# Patient Record
Sex: Female | Born: 1955 | Hispanic: Yes | Marital: Married | Smoking: Never smoker
Health system: Southern US, Community
[De-identification: ages and names within clinical notes are randomized; demographics above are authoritative.]

## PROBLEM LIST (undated history)

## (undated) DIAGNOSIS — I10 Essential (primary) hypertension: Secondary | ICD-10-CM

## (undated) HISTORY — DX: Essential (primary) hypertension: I10

---

## 2011-05-16 ENCOUNTER — Other Ambulatory Visit: Payer: Self-pay | Admitting: Obstetrics and Gynecology

## 2011-05-16 DIAGNOSIS — N63 Unspecified lump in unspecified breast: Secondary | ICD-10-CM

## 2011-05-16 DIAGNOSIS — N644 Mastodynia: Secondary | ICD-10-CM

## 2011-06-02 ENCOUNTER — Ambulatory Visit
Admission: RE | Admit: 2011-06-02 | Discharge: 2011-06-02 | Disposition: A | Discharge status: Home / Self Care | Payer: 59 | Source: Ambulatory Visit | Attending: Obstetrics and Gynecology | Admitting: Obstetrics and Gynecology

## 2011-06-02 ENCOUNTER — Other Ambulatory Visit: Payer: Self-pay | Admitting: Obstetrics and Gynecology

## 2011-06-02 DIAGNOSIS — N644 Mastodynia: Secondary | ICD-10-CM

## 2011-06-02 DIAGNOSIS — N63 Unspecified lump in unspecified breast: Secondary | ICD-10-CM

## 2012-05-02 ENCOUNTER — Other Ambulatory Visit: Payer: Self-pay | Admitting: Obstetrics and Gynecology

## 2012-05-02 DIAGNOSIS — Z872 Personal history of diseases of the skin and subcutaneous tissue: Secondary | ICD-10-CM

## 2012-05-02 DIAGNOSIS — Z1231 Encounter for screening mammogram for malignant neoplasm of breast: Secondary | ICD-10-CM

## 2012-06-07 ENCOUNTER — Ambulatory Visit
Admission: RE | Admit: 2012-06-07 | Discharge: 2012-06-07 | Disposition: A | Discharge status: Home / Self Care | Payer: 59 | Source: Ambulatory Visit | Attending: Obstetrics and Gynecology | Admitting: Obstetrics and Gynecology

## 2012-06-07 DIAGNOSIS — Z872 Personal history of diseases of the skin and subcutaneous tissue: Secondary | ICD-10-CM

## 2012-06-07 DIAGNOSIS — Z1231 Encounter for screening mammogram for malignant neoplasm of breast: Secondary | ICD-10-CM

## 2012-06-12 ENCOUNTER — Encounter: Payer: Self-pay | Admitting: Obstetrics and Gynecology

## 2012-06-19 ENCOUNTER — Encounter: Payer: Self-pay | Admitting: Obstetrics and Gynecology

## 2012-06-19 ENCOUNTER — Ambulatory Visit (INDEPENDENT_AMBULATORY_CARE_PROVIDER_SITE_OTHER): Payer: 59 | Admitting: Obstetrics and Gynecology

## 2012-06-19 VITALS — BP 122/80 | Ht 59.0 in | Wt 111.0 lb

## 2012-06-19 DIAGNOSIS — Z124 Encounter for screening for malignant neoplasm of cervix: Secondary | ICD-10-CM

## 2012-06-19 NOTE — Patient Instructions (Signed)
Control del estrés  (Stress Management)  El estrés es un estado de tensión físico o mental que puede ser el resultado de cambios en su vida o en su rutina habitual. Algunas causas frecuentes de estrés son:  · La muerte de los seres queridos.  · Daños o enfermedades graves.  · Ser despedido o cambiar de empleo.  · O mudarse a una nueva casa.  Causas pueden ser:  · Problemas sexuales.  · Pérdidas económicas o financieras.  · Tener grandes deudas.  · Conflictos con alguna persona en la casa o en el empleo.  · O cansancio permanente por falta de sueño.  No sólo las cosas malas producen estrés. Puede ser estresante:  · Ganar la lotería.  · Casarse.  · Comprar Un automóvil nuevo.  La cantidad de estrés que puede tolerarse fácilmente, varía de una persona a otra. Los cambios generalmente causan estrés, independientemente del tipo de cambio. Demasiado estrés puede afectar la salud. Puede conducir a problemas físicos o emocionales. Demasiado poco estrés (aburrimiento) también puede llegar a ser estresante.  SUGERENCIAS PARA REDUCIR EL ESTRÉS:  · Converse acerca de las cosas que lo preocupan con su familia o sus amigos. Generalmente es bueno compartir las preocupaciones. Si siente que el problema es grave, podrá necesitar ayuda de un profesional para que lo oriente.  · Considere los problemas de a uno por vez, en lugar de tratarlos todos juntos. Tratar de ocuparse de todo al mismo tiempo es imposible. Haga una lista de todas las cosas que necesita hacer y luego comience por la más importante. Propóngase el objetivo de hacer 2 ó 3 cosas por día. Si se propone hacer demasiadas cosas en un día, por supuesto fracasará, y esto le causará aún más estrés.  · No consuma drogas ni alcohol para aliviar el estrés. Aunque lo hagan sentir mejor por un breve tiempo, no resuelven los problemas que causa el estrés. También pueden causarle adicción.  · Practique ejercicios con regularidad, al menos tres veces por semana. El ejercicio físico  puede ayudarlo a aliviar la sensación de "nerviosismo" y lo hará relajar.  · La distancia más corta entre la desesperación y la esperanza a menudo es una buena noche de sueño.  · Vaya a dormir y levántese con el tiempo suficiente para cumplir con sus obligaciones sin estar apurado.  · Tómese un breve período de descanso de las situaciones estresantes que se producen durante el día. Cierre los ojos y respire profundo. Comience con los músculos del rostro, ténselos, mantenga durante algunos segundos y luego relájese. Repítalo con los músculos del cuello, hombros, manos, estómago, espalda y piernas.  · Cuídese adecuadamente. Consuma una dieta balanceada y descanse lo suficiente.  · Programe un tiempo para divertirse. Tómese un descanso en la rutina diaria para relajarse.  INSTRUCCIONES PARA EL CUIDADO DOMICILIARIO  · Pida ayuda si se siente abrumado por sus problemas y siente que no puede controlarlos solo.  · Regrese inmediatamente si siente que se está lastimando a usted mismo o a otra persona.  Document Released: 05/17/2005 Document Revised: 10/30/2011  [employer]® Patient Information ©2013 [employer].

## 2012-06-19 NOTE — Progress Notes (Signed)
Last Pap: 04/2011 WNL: Yes Regular Periods:no Contraception: n/a  Monthly Breast exam:yes Tetanus<71yrs:yes Nl.Bladder Function:yes Daily BMs:yes Healthy Diet:yes Calcium:yes Mammogram:yes Date of Mammogram: 06/07/12 Exercise:yes Have often Exercise: walking Seatbelt: yes Abuse at home: no Stressful work:no Sigmoid-colonoscopy: n/a Bone Density: No PCP: Dr. Gayleen Orem Change in PMH: no change Change in FMH:no change  Pt wants to discuss dense breast letter she received in the mail BP 122/80  Ht 4\' 11"  (1.499 m)  Wt 111 lb (50.349 kg)  BMI 22.42 kg/m2 Pt with complaints:yes and her husband has erectile dysfunction.  She wants tips on sexual satisfaction Physical Examination: General appearance - alert, well appearing, and in no distress Mental status - normal mood, behavior, speech, dress, motor activity, and thought processes Neck - supple, no significant adenopathy,  thyroid exam: thyroid is normal in size without nodules or tenderness Chest - clear to auscultation, no wheezes, rales or rhonchi, symmetric air entry Heart - normal rate and regular rhythm Abdomen - soft, nontender, nondistended, no masses or organomegaly Breasts - breasts appear normal, no suspicious masses, no skin or nipple changes or axillary nodes Pelvic - normal external genitalia, vulva, vagina, cervix, uterus and adnexa Rectal - declined by the patient Back exam - full range of motion, no tenderness, palpable spasm or pain on motion Neurological - alert, oriented, normal speech, no focal findings or movement disorder noted Musculoskeletal - no joint tenderness, deformity or swelling Extremities - no edema, redness or tenderness in the calves or thighs Skin - normal coloration and turgor, no rashes, no suspicious skin lesions noted Routine exam Pap sent yes Mammogram due no pt declined colonoscopy.  pt given hemocult cards  RT 1 yr

## 2012-06-20 LAB — PAP IG W/ RFLX HPV ASCU

## 2013-05-20 ENCOUNTER — Other Ambulatory Visit: Payer: Self-pay

## 2013-05-20 DIAGNOSIS — Z1231 Encounter for screening mammogram for malignant neoplasm of breast: Secondary | ICD-10-CM

## 2013-06-09 ENCOUNTER — Ambulatory Visit: Payer: 59

## 2013-07-01 ENCOUNTER — Ambulatory Visit
Admission: RE | Admit: 2013-07-01 | Discharge: 2013-07-01 | Disposition: A | Discharge status: Home / Self Care | Payer: 59 | Source: Ambulatory Visit

## 2013-07-01 DIAGNOSIS — Z1231 Encounter for screening mammogram for malignant neoplasm of breast: Secondary | ICD-10-CM

## 2014-05-28 ENCOUNTER — Other Ambulatory Visit: Payer: Self-pay

## 2014-05-28 DIAGNOSIS — Z1239 Encounter for other screening for malignant neoplasm of breast: Secondary | ICD-10-CM

## 2014-07-01 ENCOUNTER — Other Ambulatory Visit: Payer: Self-pay

## 2014-07-01 DIAGNOSIS — Z1231 Encounter for screening mammogram for malignant neoplasm of breast: Secondary | ICD-10-CM

## 2014-07-02 ENCOUNTER — Ambulatory Visit
Admission: RE | Admit: 2014-07-02 | Discharge: 2014-07-02 | Disposition: A | Discharge status: Home / Self Care | Payer: 59 | Source: Ambulatory Visit

## 2014-07-02 DIAGNOSIS — Z1231 Encounter for screening mammogram for malignant neoplasm of breast: Secondary | ICD-10-CM

## 2015-06-08 ENCOUNTER — Other Ambulatory Visit: Payer: Self-pay

## 2015-06-08 DIAGNOSIS — Z1231 Encounter for screening mammogram for malignant neoplasm of breast: Secondary | ICD-10-CM

## 2015-07-30 ENCOUNTER — Ambulatory Visit
Admission: RE | Admit: 2015-07-30 | Discharge: 2015-07-30 | Disposition: A | Discharge status: Home / Self Care | Payer: 59 | Source: Ambulatory Visit

## 2015-07-30 DIAGNOSIS — Z1231 Encounter for screening mammogram for malignant neoplasm of breast: Secondary | ICD-10-CM

## 2016-07-05 ENCOUNTER — Other Ambulatory Visit: Payer: Self-pay | Admitting: Obstetrics

## 2016-07-05 DIAGNOSIS — Z1231 Encounter for screening mammogram for malignant neoplasm of breast: Secondary | ICD-10-CM

## 2016-08-02 ENCOUNTER — Ambulatory Visit
Admission: RE | Admit: 2016-08-02 | Discharge: 2016-08-02 | Disposition: A | Discharge status: Home / Self Care | Payer: 59 | Source: Ambulatory Visit | Attending: Obstetrics | Admitting: Obstetrics

## 2016-08-02 DIAGNOSIS — Z1231 Encounter for screening mammogram for malignant neoplasm of breast: Secondary | ICD-10-CM

## 2017-11-21 ENCOUNTER — Other Ambulatory Visit: Payer: Self-pay | Admitting: Family Medicine

## 2017-11-21 DIAGNOSIS — Z1231 Encounter for screening mammogram for malignant neoplasm of breast: Secondary | ICD-10-CM

## 2017-12-19 ENCOUNTER — Ambulatory Visit
Admission: RE | Admit: 2017-12-19 | Discharge: 2017-12-19 | Disposition: A | Discharge status: Home / Self Care | Payer: PRIVATE HEALTH INSURANCE | Source: Ambulatory Visit | Attending: Family Medicine | Admitting: Family Medicine

## 2017-12-19 DIAGNOSIS — Z1231 Encounter for screening mammogram for malignant neoplasm of breast: Secondary | ICD-10-CM

## 2019-03-06 ENCOUNTER — Other Ambulatory Visit: Payer: Self-pay | Admitting: Family Medicine

## 2019-03-06 DIAGNOSIS — Z1231 Encounter for screening mammogram for malignant neoplasm of breast: Secondary | ICD-10-CM

## 2019-04-22 ENCOUNTER — Ambulatory Visit
Admission: RE | Admit: 2019-04-22 | Discharge: 2019-04-22 | Disposition: A | Discharge status: Home / Self Care | Payer: PRIVATE HEALTH INSURANCE | Source: Ambulatory Visit | Attending: Family Medicine | Admitting: Family Medicine

## 2019-04-22 ENCOUNTER — Other Ambulatory Visit: Payer: Self-pay

## 2019-04-22 DIAGNOSIS — Z1231 Encounter for screening mammogram for malignant neoplasm of breast: Secondary | ICD-10-CM

## 2019-10-18 ENCOUNTER — Ambulatory Visit: Payer: PRIVATE HEALTH INSURANCE | Attending: Internal Medicine

## 2019-10-18 DIAGNOSIS — Z23 Encounter for immunization: Secondary | ICD-10-CM | POA: Insufficient documentation

## 2019-10-18 NOTE — Progress Notes (Signed)
   Covid-19 Vaccination Clinic  Name:  Kimberly Woodard    MRN: [id number] DOB: 1955-09-15  10/18/2019  Kimberly Woodard was observed post Covid-19 immunization for 15 minutes without incidence. She was provided with Vaccine Information Sheet and instruction to access the V-Safe system.   Kimberly Woodard was instructed to call 911 with any severe reactions post vaccine: Marland Kitchen Difficulty breathing  . Swelling of your face and throat  . A fast heartbeat  . A bad rash all over your body  . Dizziness and weakness    Immunizations Administered    Name Date Dose VIS Date Route   Pfizer COVID-19 Vaccine 10/18/2019 11:06 AM 0.3 mL 08/01/2019 Intramuscular   Manufacturer: [employer], [employer]   Lot: [id number]   NDC: [address]-1

## 2019-11-08 ENCOUNTER — Ambulatory Visit: Payer: PRIVATE HEALTH INSURANCE | Attending: Internal Medicine

## 2019-11-08 DIAGNOSIS — Z23 Encounter for immunization: Secondary | ICD-10-CM

## 2019-11-08 NOTE — Progress Notes (Signed)
   Covid-19 Vaccination Clinic  Name:  JANESA DOCKERY    MRN: [id number] DOB: June 20, 1956  11/08/2019  Ms. England was observed post Covid-19 immunization for 15 minutes without incident. She was provided with Vaccine Information Sheet and instruction to access the V-Safe system.   Ms. Sangiovanni was instructed to call 911 with any severe reactions post vaccine: Marland Kitchen Difficulty breathing  . Swelling of face and throat  . A fast heartbeat  . A bad rash all over body  . Dizziness and weakness   Immunizations Administered    Name Date Dose VIS Date Route   Pfizer COVID-19 Vaccine 11/08/2019 12:22 PM 0.3 mL 08/01/2019 Intramuscular   Manufacturer: [employer], [employer]   Lot: [id number]   NDC: [address]-6

## 2019-11-12 ENCOUNTER — Ambulatory Visit: Payer: PRIVATE HEALTH INSURANCE

## 2020-05-31 ENCOUNTER — Other Ambulatory Visit: Payer: Self-pay | Admitting: Family Medicine

## 2020-05-31 DIAGNOSIS — Z1231 Encounter for screening mammogram for malignant neoplasm of breast: Secondary | ICD-10-CM

## 2020-06-24 ENCOUNTER — Ambulatory Visit
Admission: RE | Admit: 2020-06-24 | Discharge: 2020-06-24 | Disposition: A | Discharge status: Home / Self Care | Payer: PRIVATE HEALTH INSURANCE | Source: Ambulatory Visit | Attending: Family Medicine | Admitting: Family Medicine

## 2020-06-24 ENCOUNTER — Other Ambulatory Visit: Payer: Self-pay

## 2020-06-24 DIAGNOSIS — Z1231 Encounter for screening mammogram for malignant neoplasm of breast: Secondary | ICD-10-CM

## 2020-08-03 ENCOUNTER — Other Ambulatory Visit: Payer: Self-pay

## 2020-08-03 ENCOUNTER — Ambulatory Visit
Admission: RE | Admit: 2020-08-03 | Discharge: 2020-08-03 | Disposition: A | Discharge status: Home / Self Care | Payer: PRIVATE HEALTH INSURANCE | Source: Ambulatory Visit | Attending: Family Medicine | Admitting: Family Medicine

## 2021-09-12 ENCOUNTER — Other Ambulatory Visit: Payer: Self-pay | Admitting: Family Medicine

## 2021-09-12 DIAGNOSIS — Z1231 Encounter for screening mammogram for malignant neoplasm of breast: Secondary | ICD-10-CM

## 2021-09-21 ENCOUNTER — Ambulatory Visit
Admission: RE | Admit: 2021-09-21 | Discharge: 2021-09-21 | Disposition: A | Discharge status: Home / Self Care | Payer: BC Managed Care – PPO | Source: Ambulatory Visit | Attending: Family Medicine | Admitting: Family Medicine

## 2021-09-21 DIAGNOSIS — Z1231 Encounter for screening mammogram for malignant neoplasm of breast: Secondary | ICD-10-CM

## 2023-06-18 IMAGING — MG MM DIGITAL SCREENING BILAT W/ TOMO AND CAD
8 series · 9 of 24 positions shown · non-contrast
Comparison: Previous exam(s).

CLINICAL DATA: Screening.

EXAM:
DIGITAL SCREENING BILATERAL MAMMOGRAM WITH TOMOSYNTHESIS AND CAD
TECHNIQUE: Bilateral screening digital craniocaudal and mediolateral oblique
mammograms were obtained. Bilateral screening digital breast
tomosynthesis was performed. The images were evaluated with
computer-aided detection.

[L MLO synth-2D]
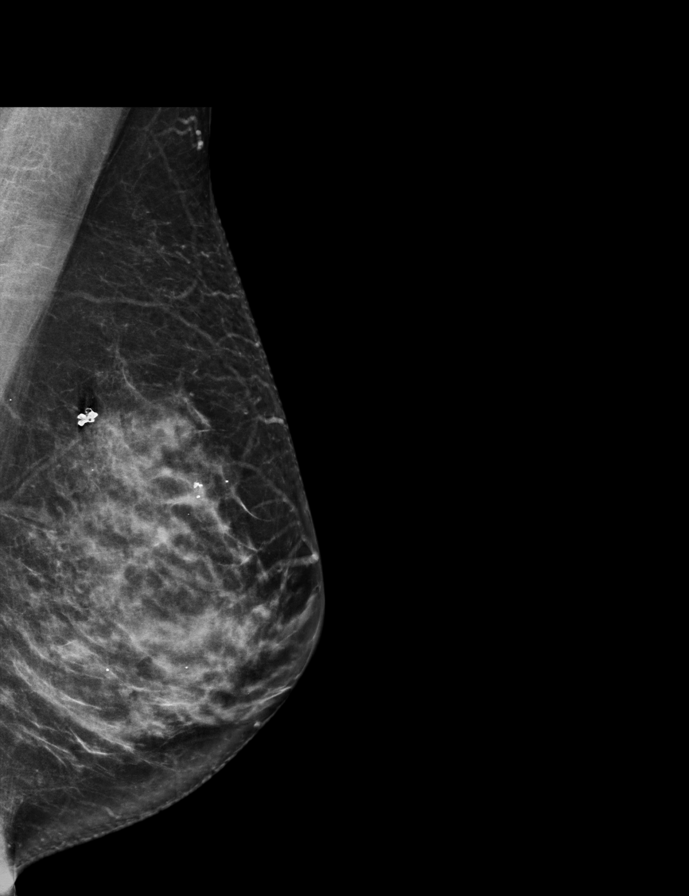

[R MLO synth-2D]
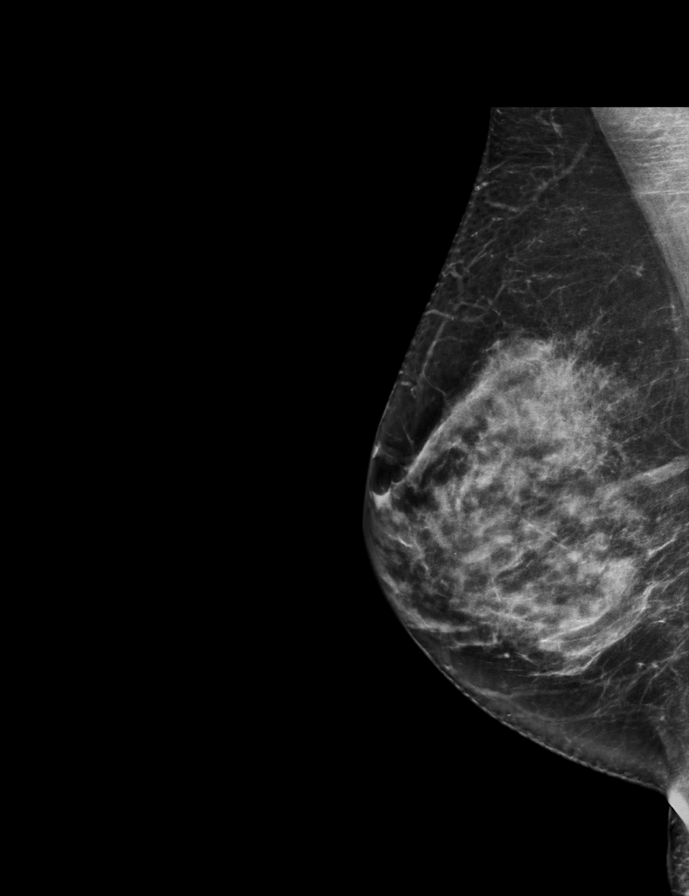

[L CC synth-2D]
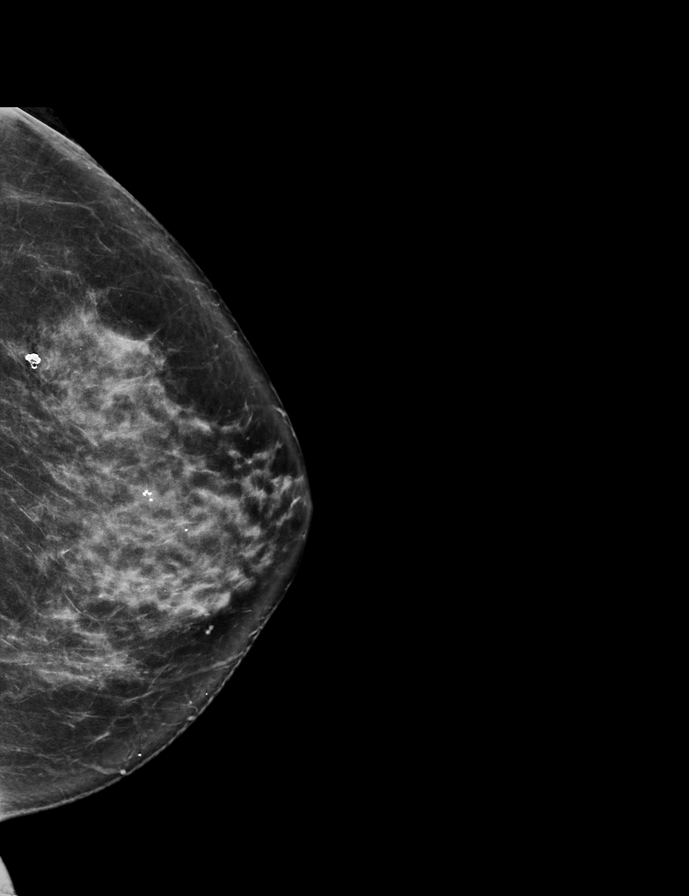

[R CC synth-2D]
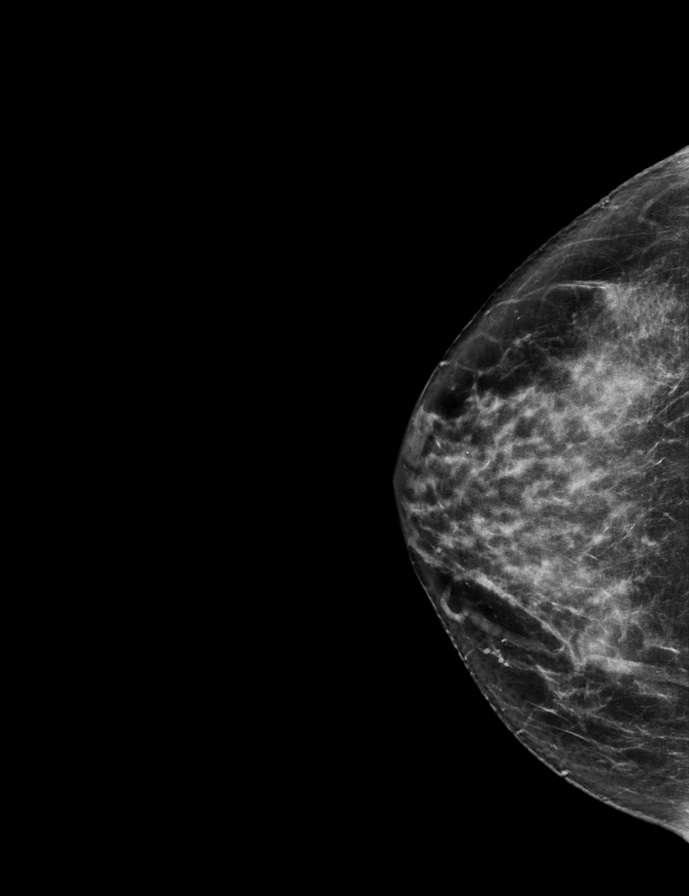

[L CC tomo · 2 of 73 frames shown]
[frame 24/73]
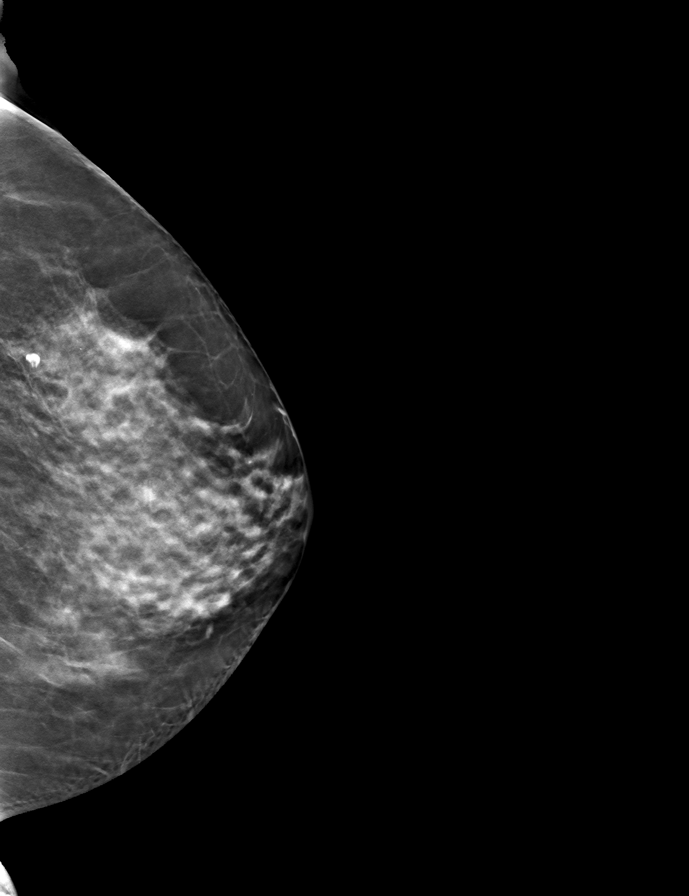
[frame 37/73]
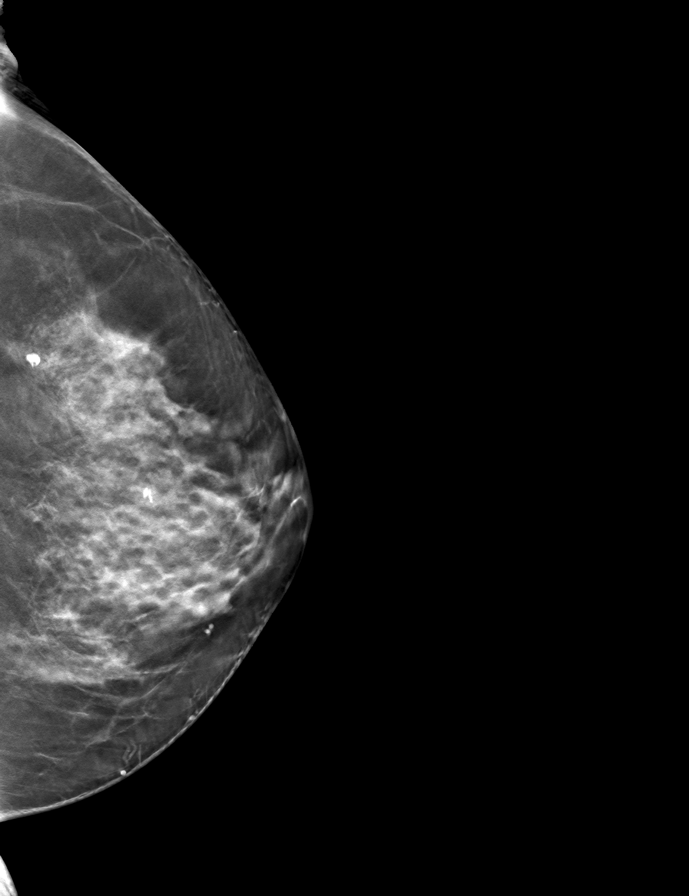

[R MLO tomo · tomo slice 35/69.0]
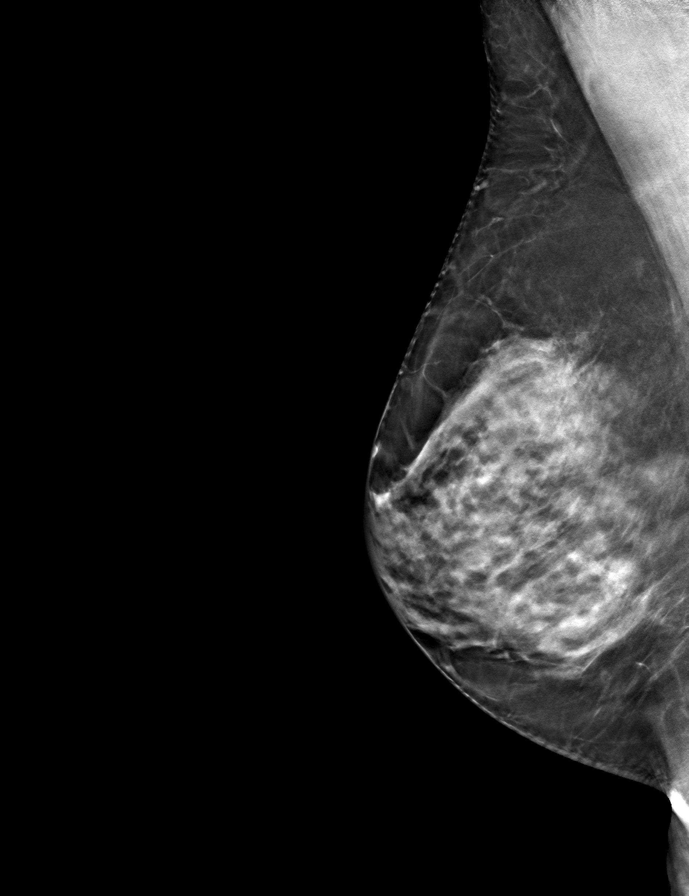

[R CC tomo · tomo slice 35/68.0]
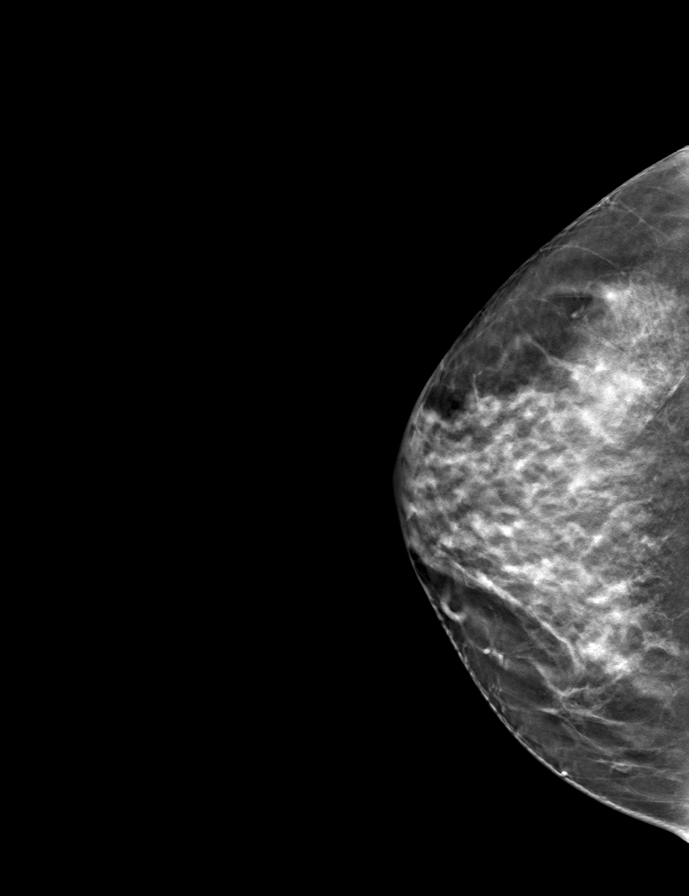

[L MLO tomo · tomo slice 35/70.0]
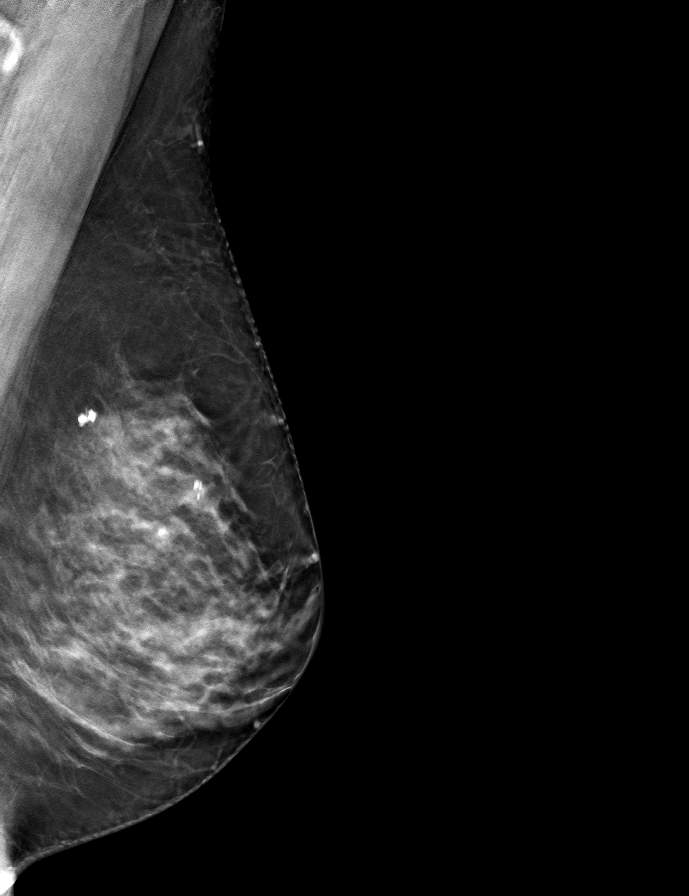

[9 of 24 positions shown; findings below may reference images not displayed]

ACR Breast Density Category c: The breast tissue is heterogeneously
dense, which may obscure small masses.
FINDINGS: There are no findings suspicious for malignancy.
IMPRESSION: No mammographic evidence of malignancy. A result letter of this
screening mammogram will be mailed directly to the patient.

RECOMMENDATION:
Screening mammogram in one year. (Code:Q3-W-BC3)

BI-RADS CATEGORY  1: Negative.

## 2023-09-04 ENCOUNTER — Other Ambulatory Visit: Payer: Self-pay | Admitting: Family Medicine

## 2023-09-04 DIAGNOSIS — Z1231 Encounter for screening mammogram for malignant neoplasm of breast: Secondary | ICD-10-CM

## 2023-09-20 ENCOUNTER — Other Ambulatory Visit: Payer: Self-pay | Admitting: Family Medicine

## 2023-09-20 DIAGNOSIS — Z780 Asymptomatic menopausal state: Secondary | ICD-10-CM

## 2023-09-26 ENCOUNTER — Ambulatory Visit
Admission: RE | Admit: 2023-09-26 | Discharge: 2023-09-26 | Disposition: A | Discharge status: Home / Self Care | Payer: Medicare HMO | Source: Ambulatory Visit | Attending: Family Medicine | Admitting: Family Medicine

## 2023-09-26 DIAGNOSIS — Z1231 Encounter for screening mammogram for malignant neoplasm of breast: Secondary | ICD-10-CM

## 2024-05-12 ENCOUNTER — Other Ambulatory Visit: Payer: PRIVATE HEALTH INSURANCE
